# Patient Record
Sex: Female | Born: 1985 | Race: Black or African American | Hispanic: No | Marital: Single | Smoking: Never smoker
Health system: Southern US, Community
[De-identification: ages and names within clinical notes are randomized; demographics above are authoritative.]

## PROBLEM LIST (undated history)

## (undated) DIAGNOSIS — L309 Dermatitis, unspecified: Secondary | ICD-10-CM

## (undated) DIAGNOSIS — B009 Herpesviral infection, unspecified: Secondary | ICD-10-CM

## (undated) HISTORY — DX: Dermatitis, unspecified: L30.9

## (undated) HISTORY — DX: Herpesviral infection, unspecified: B00.9

---
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
## 2004-01-29 ENCOUNTER — Ambulatory Visit: Payer: Self-pay | Admitting: Family Medicine

## 2004-06-20 ENCOUNTER — Ambulatory Visit: Payer: Self-pay | Admitting: Family Medicine

## 2004-06-30 ENCOUNTER — Emergency Department (HOSPITAL_COMMUNITY): Admission: EM | Admit: 2004-06-30 | Discharge: 2004-06-30 | Payer: Self-pay | Admitting: Emergency Medicine

## 2004-10-24 ENCOUNTER — Emergency Department (HOSPITAL_COMMUNITY): Admission: EM | Admit: 2004-10-24 | Discharge: 2004-10-24 | Payer: Self-pay | Admitting: Family Medicine

## 2004-10-25 ENCOUNTER — Ambulatory Visit: Payer: Self-pay | Admitting: Family Medicine

## 2004-10-25 ENCOUNTER — Inpatient Hospital Stay (HOSPITAL_COMMUNITY): Admission: RE | Admit: 2004-10-25 | Discharge: 2004-10-25 | Payer: Self-pay | Admitting: *Deleted

## 2004-10-28 ENCOUNTER — Ambulatory Visit: Payer: Self-pay | Admitting: Family Medicine

## 2004-10-29 ENCOUNTER — Encounter (INDEPENDENT_AMBULATORY_CARE_PROVIDER_SITE_OTHER): Payer: Self-pay | Admitting: *Deleted

## 2004-10-29 LAB — CONVERTED CEMR LAB

## 2004-11-06 ENCOUNTER — Ambulatory Visit: Payer: Self-pay | Admitting: Family Medicine

## 2004-11-08 ENCOUNTER — Ambulatory Visit (HOSPITAL_COMMUNITY): Admission: RE | Admit: 2004-11-08 | Discharge: 2004-11-08 | Payer: Self-pay | Admitting: Family Medicine

## 2004-11-11 ENCOUNTER — Ambulatory Visit: Payer: Self-pay | Admitting: Family Medicine

## 2004-11-13 ENCOUNTER — Ambulatory Visit: Payer: Self-pay | Admitting: Family Medicine

## 2004-11-13 ENCOUNTER — Encounter: Admission: RE | Admit: 2004-11-13 | Discharge: 2004-11-13 | Payer: Self-pay | Admitting: Sports Medicine

## 2004-12-26 ENCOUNTER — Ambulatory Visit: Payer: Self-pay | Admitting: Family Medicine

## 2005-01-24 ENCOUNTER — Ambulatory Visit: Payer: Self-pay | Admitting: Family Medicine

## 2005-01-31 ENCOUNTER — Ambulatory Visit (HOSPITAL_COMMUNITY): Admission: RE | Admit: 2005-01-31 | Discharge: 2005-01-31 | Payer: Self-pay | Admitting: Sports Medicine

## 2005-02-11 ENCOUNTER — Ambulatory Visit: Payer: Self-pay | Admitting: Family Medicine

## 2005-03-14 ENCOUNTER — Ambulatory Visit: Payer: Self-pay | Admitting: Family Medicine

## 2005-04-18 ENCOUNTER — Ambulatory Visit: Payer: Self-pay | Admitting: Family Medicine

## 2005-05-02 ENCOUNTER — Ambulatory Visit: Payer: Self-pay | Admitting: Family Medicine

## 2005-05-16 ENCOUNTER — Ambulatory Visit: Payer: Self-pay | Admitting: Sports Medicine

## 2005-05-20 ENCOUNTER — Inpatient Hospital Stay (HOSPITAL_COMMUNITY): Admission: AD | Admit: 2005-05-20 | Discharge: 2005-05-20 | Payer: Self-pay | Admitting: Family Medicine

## 2005-05-20 ENCOUNTER — Ambulatory Visit: Payer: Self-pay | Admitting: Obstetrics and Gynecology

## 2005-05-30 ENCOUNTER — Ambulatory Visit: Payer: Self-pay | Admitting: Family Medicine

## 2005-06-07 ENCOUNTER — Ambulatory Visit: Payer: Self-pay | Admitting: Obstetrics and Gynecology

## 2005-06-07 ENCOUNTER — Inpatient Hospital Stay (HOSPITAL_COMMUNITY): Admission: AD | Admit: 2005-06-07 | Discharge: 2005-06-07 | Payer: Self-pay | Admitting: Obstetrics and Gynecology

## 2005-06-10 ENCOUNTER — Ambulatory Visit: Payer: Self-pay | Admitting: Family Medicine

## 2005-06-17 ENCOUNTER — Ambulatory Visit: Payer: Self-pay | Admitting: Family Medicine

## 2005-06-26 ENCOUNTER — Ambulatory Visit: Payer: Self-pay | Admitting: Family Medicine

## 2005-06-29 ENCOUNTER — Inpatient Hospital Stay (HOSPITAL_COMMUNITY): Admission: AD | Admit: 2005-06-29 | Discharge: 2005-06-29 | Payer: Self-pay | Admitting: Family Medicine

## 2005-06-29 ENCOUNTER — Ambulatory Visit: Payer: Self-pay | Admitting: Family Medicine

## 2005-06-29 ENCOUNTER — Inpatient Hospital Stay (HOSPITAL_COMMUNITY): Admission: AD | Admit: 2005-06-29 | Discharge: 2005-07-02 | Payer: Self-pay | Admitting: Family Medicine

## 2005-08-08 ENCOUNTER — Ambulatory Visit: Payer: Self-pay | Admitting: Family Medicine

## 2006-05-29 ENCOUNTER — Encounter (INDEPENDENT_AMBULATORY_CARE_PROVIDER_SITE_OTHER): Payer: Self-pay | Admitting: *Deleted

## 2006-06-02 ENCOUNTER — Emergency Department (HOSPITAL_COMMUNITY): Admission: EM | Admit: 2006-06-02 | Discharge: 2006-06-02 | Payer: Self-pay | Admitting: Emergency Medicine

## 2006-06-22 ENCOUNTER — Telehealth (INDEPENDENT_AMBULATORY_CARE_PROVIDER_SITE_OTHER): Payer: Self-pay | Admitting: *Deleted

## 2006-06-26 ENCOUNTER — Telehealth (INDEPENDENT_AMBULATORY_CARE_PROVIDER_SITE_OTHER): Payer: Self-pay | Admitting: Family Medicine

## 2006-06-26 ENCOUNTER — Ambulatory Visit: Payer: Self-pay | Admitting: Family Medicine

## 2006-06-26 DIAGNOSIS — L259 Unspecified contact dermatitis, unspecified cause: Secondary | ICD-10-CM

## 2006-06-30 ENCOUNTER — Encounter: Payer: Self-pay | Admitting: Family Medicine

## 2006-06-30 ENCOUNTER — Ambulatory Visit: Payer: Self-pay | Admitting: Sports Medicine

## 2006-06-30 ENCOUNTER — Telehealth: Payer: Self-pay | Admitting: *Deleted

## 2006-06-30 LAB — CONVERTED CEMR LAB: Whiff Test: POSITIVE

## 2006-07-01 LAB — CONVERTED CEMR LAB
Chlamydia, DNA Probe: POSITIVE — AB
GC Probe Amp, Genital: NEGATIVE

## 2006-07-02 ENCOUNTER — Ambulatory Visit: Payer: Self-pay | Admitting: Sports Medicine

## 2006-08-21 ENCOUNTER — Encounter: Payer: Self-pay | Admitting: *Deleted

## 2006-09-16 ENCOUNTER — Ambulatory Visit: Payer: Self-pay

## 2006-09-16 ENCOUNTER — Encounter: Payer: Self-pay | Admitting: Family Medicine

## 2006-09-16 ENCOUNTER — Telehealth: Payer: Self-pay | Admitting: *Deleted

## 2006-09-16 DIAGNOSIS — B009 Herpesviral infection, unspecified: Secondary | ICD-10-CM | POA: Insufficient documentation

## 2006-09-16 LAB — CONVERTED CEMR LAB
Chlamydia, DNA Probe: NEGATIVE
GC Probe Amp, Genital: NEGATIVE

## 2006-09-17 ENCOUNTER — Encounter: Payer: Self-pay | Admitting: Family Medicine

## 2006-09-18 ENCOUNTER — Telehealth: Payer: Self-pay | Admitting: *Deleted

## 2006-09-19 ENCOUNTER — Emergency Department (HOSPITAL_COMMUNITY): Admission: EM | Admit: 2006-09-19 | Discharge: 2006-09-19 | Payer: Self-pay | Admitting: Emergency Medicine

## 2006-09-21 ENCOUNTER — Telehealth: Payer: Self-pay | Admitting: Family Medicine

## 2006-09-22 ENCOUNTER — Encounter: Payer: Self-pay | Admitting: Family Medicine

## 2006-09-25 ENCOUNTER — Telehealth: Payer: Self-pay | Admitting: *Deleted

## 2006-10-12 ENCOUNTER — Telehealth: Payer: Self-pay | Admitting: *Deleted

## 2006-10-14 ENCOUNTER — Ambulatory Visit: Payer: Self-pay

## 2006-11-22 ENCOUNTER — Emergency Department (HOSPITAL_COMMUNITY): Admission: EM | Admit: 2006-11-22 | Discharge: 2006-11-22 | Payer: Self-pay | Admitting: Emergency Medicine

## 2006-12-01 ENCOUNTER — Ambulatory Visit: Payer: Self-pay | Admitting: Family Medicine

## 2006-12-01 DIAGNOSIS — F411 Generalized anxiety disorder: Secondary | ICD-10-CM | POA: Insufficient documentation

## 2006-12-21 ENCOUNTER — Telehealth: Payer: Self-pay | Admitting: *Deleted

## 2006-12-22 ENCOUNTER — Encounter (INDEPENDENT_AMBULATORY_CARE_PROVIDER_SITE_OTHER): Payer: Self-pay | Admitting: Family Medicine

## 2006-12-22 ENCOUNTER — Ambulatory Visit: Payer: Self-pay | Admitting: Family Medicine

## 2006-12-22 LAB — CONVERTED CEMR LAB
Beta hcg, urine, semiquantitative: NEGATIVE
Whiff Test: NEGATIVE

## 2006-12-23 ENCOUNTER — Encounter (INDEPENDENT_AMBULATORY_CARE_PROVIDER_SITE_OTHER): Payer: Self-pay | Admitting: *Deleted

## 2006-12-23 LAB — CONVERTED CEMR LAB
Chlamydia, DNA Probe: POSITIVE — AB
GC Probe Amp, Genital: NEGATIVE
HCT: 43.1 % (ref 36.0–46.0)
Hemoglobin: 13.9 g/dL (ref 12.0–15.0)
Hepatitis B Surface Ag: NEGATIVE
MCHC: 32.3 g/dL (ref 30.0–36.0)
MCV: 89 fL (ref 78.0–100.0)
Platelets: 284 10*3/uL (ref 150–400)
RBC: 4.84 M/uL (ref 3.87–5.11)
RDW: 13.3 % (ref 11.5–14.0)
WBC: 3.7 10*3/uL — ABNORMAL LOW (ref 4.0–10.5)

## 2006-12-24 ENCOUNTER — Ambulatory Visit: Payer: Self-pay | Admitting: Family Medicine

## 2006-12-28 ENCOUNTER — Telehealth (INDEPENDENT_AMBULATORY_CARE_PROVIDER_SITE_OTHER): Payer: Self-pay | Admitting: Family Medicine

## 2007-01-18 ENCOUNTER — Ambulatory Visit: Payer: Self-pay | Admitting: Sports Medicine

## 2007-01-18 ENCOUNTER — Encounter (INDEPENDENT_AMBULATORY_CARE_PROVIDER_SITE_OTHER): Payer: Self-pay | Admitting: Family Medicine

## 2007-01-18 ENCOUNTER — Telehealth (INDEPENDENT_AMBULATORY_CARE_PROVIDER_SITE_OTHER): Payer: Self-pay | Admitting: *Deleted

## 2007-01-19 LAB — CONVERTED CEMR LAB
Chlamydia, DNA Probe: NEGATIVE
GC Probe Amp, Genital: NEGATIVE

## 2007-03-19 ENCOUNTER — Ambulatory Visit: Payer: Self-pay | Admitting: Family Medicine

## 2007-03-19 ENCOUNTER — Encounter (INDEPENDENT_AMBULATORY_CARE_PROVIDER_SITE_OTHER): Payer: Self-pay | Admitting: Family Medicine

## 2007-03-19 ENCOUNTER — Telehealth: Payer: Self-pay | Admitting: *Deleted

## 2007-03-19 LAB — CONVERTED CEMR LAB: Whiff Test: POSITIVE

## 2007-04-07 ENCOUNTER — Telehealth (INDEPENDENT_AMBULATORY_CARE_PROVIDER_SITE_OTHER): Payer: Self-pay | Admitting: Family Medicine

## 2007-04-07 ENCOUNTER — Encounter (INDEPENDENT_AMBULATORY_CARE_PROVIDER_SITE_OTHER): Payer: Self-pay | Admitting: *Deleted

## 2008-02-08 ENCOUNTER — Telehealth: Payer: Self-pay | Admitting: *Deleted

## 2008-02-09 ENCOUNTER — Telehealth: Payer: Self-pay | Admitting: *Deleted

## 2008-02-09 ENCOUNTER — Encounter: Payer: Self-pay | Admitting: Family Medicine

## 2008-02-09 ENCOUNTER — Ambulatory Visit: Payer: Self-pay | Admitting: Family Medicine

## 2008-02-09 LAB — CONVERTED CEMR LAB
Chlamydia, DNA Probe: NEGATIVE
GC Probe Amp, Genital: NEGATIVE
Whiff Test: NEGATIVE

## 2008-02-29 ENCOUNTER — Telehealth: Payer: Self-pay | Admitting: *Deleted

## 2008-03-14 ENCOUNTER — Encounter: Payer: Self-pay | Admitting: Family Medicine

## 2008-03-28 ENCOUNTER — Telehealth: Payer: Self-pay | Admitting: *Deleted

## 2008-03-28 ENCOUNTER — Ambulatory Visit: Payer: Self-pay | Admitting: Family Medicine

## 2008-03-28 ENCOUNTER — Encounter: Payer: Self-pay | Admitting: Family Medicine

## 2008-03-29 ENCOUNTER — Telehealth: Payer: Self-pay | Admitting: *Deleted

## 2008-04-05 ENCOUNTER — Telehealth: Payer: Self-pay | Admitting: *Deleted

## 2008-05-04 ENCOUNTER — Encounter (INDEPENDENT_AMBULATORY_CARE_PROVIDER_SITE_OTHER): Payer: Self-pay | Admitting: Family Medicine

## 2008-05-04 ENCOUNTER — Ambulatory Visit: Payer: Self-pay | Admitting: Family Medicine

## 2008-05-09 ENCOUNTER — Encounter: Payer: Self-pay | Admitting: Family Medicine

## 2008-09-21 ENCOUNTER — Telehealth: Payer: Self-pay | Admitting: Family Medicine

## 2008-09-22 ENCOUNTER — Telehealth: Payer: Self-pay | Admitting: Family Medicine

## 2008-09-26 ENCOUNTER — Encounter: Payer: Self-pay | Admitting: Family Medicine

## 2008-09-29 ENCOUNTER — Encounter: Payer: Self-pay | Admitting: Family Medicine

## 2008-10-23 ENCOUNTER — Encounter: Payer: Self-pay | Admitting: Family Medicine

## 2008-10-23 ENCOUNTER — Ambulatory Visit: Payer: Self-pay | Admitting: Family Medicine

## 2008-10-23 LAB — CONVERTED CEMR LAB
Chlamydia, DNA Probe: NEGATIVE
GC Probe Amp, Genital: NEGATIVE
Whiff Test: NEGATIVE

## 2008-10-24 ENCOUNTER — Encounter: Payer: Self-pay | Admitting: Family Medicine

## 2008-12-08 ENCOUNTER — Encounter: Payer: Self-pay | Admitting: Family Medicine

## 2008-12-08 ENCOUNTER — Ambulatory Visit: Payer: Self-pay | Admitting: Family Medicine

## 2008-12-08 LAB — CONVERTED CEMR LAB
Beta hcg, urine, semiquantitative: NEGATIVE
Whiff Test: POSITIVE

## 2008-12-11 ENCOUNTER — Ambulatory Visit: Payer: Self-pay | Admitting: Family Medicine

## 2008-12-11 LAB — CONVERTED CEMR LAB
Chlamydia, DNA Probe: POSITIVE — AB
GC Probe Amp, Genital: NEGATIVE

## 2009-07-25 ENCOUNTER — Telehealth (INDEPENDENT_AMBULATORY_CARE_PROVIDER_SITE_OTHER): Payer: Self-pay | Admitting: *Deleted

## 2010-01-02 ENCOUNTER — Telehealth: Payer: Self-pay | Admitting: Family Medicine

## 2010-01-03 ENCOUNTER — Ambulatory Visit: Payer: Self-pay | Admitting: Family Medicine

## 2010-04-23 ENCOUNTER — Other Ambulatory Visit: Payer: Self-pay | Admitting: Family Medicine

## 2010-04-23 ENCOUNTER — Encounter: Payer: Self-pay | Admitting: Family Medicine

## 2010-04-23 ENCOUNTER — Ambulatory Visit: Admission: RE | Admit: 2010-04-23 | Discharge: 2010-04-23 | Payer: Self-pay | Source: Home / Self Care

## 2010-04-23 ENCOUNTER — Other Ambulatory Visit
Admission: RE | Admit: 2010-04-23 | Discharge: 2010-04-23 | Payer: Self-pay | Source: Home / Self Care | Admitting: Family Medicine

## 2010-04-23 DIAGNOSIS — N760 Acute vaginitis: Secondary | ICD-10-CM | POA: Insufficient documentation

## 2010-04-23 LAB — CONVERTED CEMR LAB
Beta hcg, urine, semiquantitative: NEGATIVE
Chlamydia, Swab/Urine, PCR: NEGATIVE
Whiff Test: POSITIVE

## 2010-04-24 ENCOUNTER — Telehealth: Payer: Self-pay | Admitting: Family Medicine

## 2010-04-30 NOTE — Assessment & Plan Note (Signed)
Summary: meds refill/[place]/white team   Vital Signs:  Patient profile:   25 year old female Height:      66 inches Weight:      118 pounds BMI:     19.11 Temp:     98.6 degrees F oral Pulse rate:   87 / minute BP sitting:   100 / 72  (left arm) Cuff size:   regular  Vitals Entered By: Jimmy Footman, CMA (January 03, 2010 1:49 PM) CC: med refill Is Patient Diabetic? No Pain Assessment Patient in pain? no        Primary Provider:  . [facility]  CC:  med refill.  History of Present Illness: Pt comes in today for eczema medication refill. Pt states that she has run out.  She believes that this most recent breakout is due to using a new body wash. Itching is worse after warm shower. She has been using moisturizers every day, "head to toe."  Uses eczema medications almost every day. No other complaints or problems.   Habits & Providers  Alcohol-Tobacco-Diet     Tobacco Status: never  Current Problems (verified): 1)  Contraceptive Management  (ICD-V25.09) 2)  Well Woman  (ICD-V70.0) 3)  Anxiety State Nos  (ICD-300.00) 4)  Hsv  (ICD-054.9) 5)  Eczema  (ICD-692.9)  Current Medications (verified): 1)  Lidex 0.05 % Crea (Fluocinonide) .... Apply To Affected Areas (Not Face) Two Times A Day Until Rash Cleared.  Then Stop 2)  Hydrocortisone 2.5 % Oint (Hydrocortisone) .... Apply To Rash On Face Two Times A Day Until Rash Clears; Then Stop  Allergies (verified): No Known Drug Allergies  Physical Exam  General:  alert, well-developed, well-nourished, and well-hydrated.   Skin:  Small area of white papules on R antecubital area with surrounding lichenification of skin. Areas of excoriation and lichenification on pt's back, mostly on side just above hips. No obvious areas of eczema on face, but multiple areas with raised, skin-colored bumps, no exudate or drainage, no erythema.    Impression & Recommendations:  Problem # 1:  ECZEMA (ICD-692.9)  While I have not seen this pt  before, her eczema appears to be relatively mild. We discussed the importance of not using the medications every day, to only use them on the affected areas when there is an active break out. Also discussed using moisturizers more frequently and avoiding bodywashes, soaps, laundry detergents, etc that have fragrances or other harsh chemicals in them; sticking to things like Dove and [employer].   Her updated medication list for this problem includes:    Lidex 0.05 % Crea (Fluocinonide) .Marland Kitchen... Apply to affected areas (not face) two times a day until rash cleared.  then stop    Hydrocortisone 2.5 % Oint (Hydrocortisone) .Marland Kitchen... Apply to rash on face two times a day until rash clears; then stop  Orders: [facility]- Est Level  3 (78469)  Problem # 2:  Preventive Health Care (ICD-V70.0) Pt due for next pap 2/12. Pt informed and will make appt for this.   Complete Medication List: 1)  Lidex 0.05 % Crea (Fluocinonide) .... Apply to affected areas (not face) two times a day until rash cleared.  then stop 2)  Hydrocortisone 2.5 % Oint (Hydrocortisone) .... Apply to rash on face two times a day until rash clears; then stop  Patient Instructions: 1)  It was nice to meet you today! 2)  I am refilling your eczema medications. Continue to use a thick moisturizing cream on your  face and body every day (twice a day if possible), but only use these medicines when you have an active eczema outbreak. Do not use these medicines routinely every day. 3)  Examples of good moisturizers include Cetaphil cream and Eucerin. You can also use vaseline on very dry areas.  Taking cool showers and drying off completely with a towel after showers may also help. 4)  Please schedule an appt for a well woman visit in Feb or March for your next pap smear.  Prescriptions: HYDROCORTISONE 2.5 % OINT (HYDROCORTISONE) apply to rash on face two times a day until rash clears; then STOP  #1large tube x 0   Entered and Authorized by:   Demetria Pore MD   Signed by:   Demetria Pore MD on 01/03/2010   Method used:   Print then Give to Patient   RxID:   [id number] LIDEX 0.05 % CREA (FLUOCINONIDE) apply to affected areas (not face) two times a day until rash cleared.  then stop  #1 tube x 1   Entered and Authorized by:   Demetria Pore MD   Signed by:   Demetria Pore MD on 01/03/2010   Method used:   Print then Give to Patient   RxID:   [id number] HYDROCORTISONE 2.5 % OINT (HYDROCORTISONE) apply to rash on face two times a day until rash clears; then STOP  #1large tube x 0   Entered and Authorized by:   Demetria Pore MD   Signed by:   Demetria Pore MD on 01/03/2010   Method used:   Print then Give to Patient   RxID:   [id number] LIDEX 0.05 % CREA (FLUOCINONIDE) apply to affected areas (not face) two times a day until rash cleared.  then stop  #1 tube x 1   Entered and Authorized by:   Demetria Pore MD   Signed by:   Demetria Pore MD on 01/03/2010   Method used:   Print then Give to Patient   RxID:   [id number]

## 2010-04-30 NOTE — Progress Notes (Signed)
Summary: refill  Phone Note Refill Request Call back at Home Phone [phone] Message from:  Patient  Refills Requested: Medication #1:  LIDEX 0.05 % CREA apply to affected areas (not face) two times a day until rash cleared.  then stop also Triamcinilone [facility]  Initial call taken by: De Nurse,  January 02, 2010 10:35 AM  Follow-up for Phone Call        told her we could not fill these as it has been over 1 yr sinc seen. she will see Dr. Fara Boros at 1:30 tomorrow Follow-up by: Golden Circle RN,  January 02, 2010 10:38 AM

## 2010-04-30 NOTE — Progress Notes (Signed)
Summary: Rx Prob  Phone Note Call from Patient Call back at [phone]   Caller: Patient Summary of Call: Pt says her rx for exzema was denied and was wondering why. Initial call taken by: Clydell Hakim,  July 25, 2009 4:39 PM  Follow-up for Phone Call        patient advised that rx has been sent in for lidex and hydrocortisone cream.  triamcinolone was denied because she is no longer on this. halog was changed to lidex.  she voices undersdtanding. Follow-up by: Theresia Lo RN,  July 25, 2009 5:03 PM

## 2010-05-02 ENCOUNTER — Ambulatory Visit (INDEPENDENT_AMBULATORY_CARE_PROVIDER_SITE_OTHER): Payer: Medicaid Other

## 2010-05-02 ENCOUNTER — Encounter: Payer: Self-pay | Admitting: Family Medicine

## 2010-05-02 DIAGNOSIS — R87610 Atypical squamous cells of undetermined significance on cytologic smear of cervix (ASC-US): Secondary | ICD-10-CM | POA: Insufficient documentation

## 2010-05-02 NOTE — Progress Notes (Signed)
Summary: meds prob  Phone Note Call from Patient Call back at Home Phone [phone]   Caller: Patient Summary of Call: needs something stronger than Fluconazole [facility] Initial call taken by: De Nurse,  April 24, 2010 11:35 AM  Follow-up for Phone Call        Called patient back. Already out of lidex lotion due to the significant involved areas. Will try a slightly different formulation. Reinforced the advice to not use everyday. Only for next 10 days. She is using lotion daily, but unsure of formulation. Advised daily vaseline, cocoa butter, eucerin or cetaphil. PATP.  Follow-up by: Lloyd Huger MD,  April 25, 2010 10:48 AM    New/Updated Medications: TRIAMCINOLONE ACETONIDE 0.5 % CREA (TRIAMCINOLONE ACETONIDE) Apply to affected areas as directed. No more than 10 days. Prescriptions: TRIAMCINOLONE ACETONIDE 0.5 % CREA (TRIAMCINOLONE ACETONIDE) Apply to affected areas as directed. No more than 10 days.  #1 x 0   Entered and Authorized by:   Lloyd Huger MD   Signed by:   Lloyd Huger MD on 04/25/2010   Method used:   Electronically to        [facility]. [address] [phone]* (retail)       [address]       [place], [place]  [address]       Ph: [phone] or [phone]       Fax: [phone]   RxID:   [id number]

## 2010-05-02 NOTE — Assessment & Plan Note (Signed)
Summary: cpp/eo  depo given, pt to rtc 04/11-04/25/2012   Vital Signs:  Patient profile:   25 year old female Height:      66 inches Weight:      117.4 pounds BMI:     19.02 Temp:     98.2 degrees F oral Pulse rate:   101 / minute BP sitting:   111 / 77  (left arm) Cuff size:   regular  Vitals Entered By: Jimmy Footman, CMA (April 23, 2010 2:27 PM) CC: cpe w/pap, vaginal discharge Is Patient Diabetic? No Pain Assessment Patient in pain? no        Primary Provider:  . [facility]  CC:  cpe w/pap and vaginal discharge.  History of Present Illness: 1. Vaginal DC. Has been present for 2 days. Hx of recurrent BV that has been treated, had metro-gel and pills in the past. Denies bleeding, pain, recent medications.   2. Birth control. Desires contraception. Unsure between pills and depo. Has taken depo injection in the past. Does not smoke, no family or personal hx of thrombophilia/VTE.  Not currently sexually active.   3. Eczema. Is controlled currently, but does need refill of her steroid ointment. Uses only when symptoms are flared.   Preventive Screening-Counseling & Management  Alcohol-Tobacco     Smoking Status: never  Allergies: No Known Drug Allergies  Past History:  Past Medical History: Last updated: 05/04/2008 Chlamydia 01/2003--treated, Menarche age 72 (5 days, regular), Plan B Rx provided 07/2003, SVD girl no complications sickle cell trait  Past Surgical History: Last updated: 05/04/2008 none  Family History: Last updated: 05/04/2008 F-, healthy half sister - sickle cell disease M-sickle cell trait sister -healthy daughter--sickle cell trait  Social History: Last updated: 05/04/2008 lives with daughter iin l; works in Clinical biochemist; taking a semester off from [employer]; no tobacco/ETOH/drugs; sexually active; no current partner  Risk Factors: Alcohol Use: 0 (12/08/2008) Exercise: no (12/01/2006)  Risk Factors: Smoking Status: never  (04/23/2010) Passive Smoke Exposure: no (12/01/2006)  Review of Systems  The patient denies anorexia, fever, dyspnea on exertion, peripheral edema, headaches, and abdominal pain.    Physical Exam  General:  Well-developed,well-nourished,in no acute distress; alert,appropriate and cooperative throughout examination Head:  normocephalic and atraumatic.   Mouth:  Oral mucosa and oropharynx without lesions or exudates.   Lungs:  Normal respiratory effort, chest expands symmetrically. Lungs are clear to auscultation, no crackles or wheezes. Heart:  Normal rate and regular rhythm. S1 and S2 normal without gallop, murmur, click, rub or other extra sounds. Genitalia:  Normal introitus for age, no external lesions, no vaginal discharge, mucosa pink and moist, no vaginal or cervical lesions, no vaginal atrophy, no friaility or hemorrhage,  no adnexal masses or tenderness Extremities:  No clubbing, cyanosis, edema, or deformity noted with normal full range of motion of all joints.   Neurologic:  No cranial nerve deficits noted. Station and gait are normal.Sensory, motor and coordinative functions appear intact. Skin:  2cm areas of dried, hyperkeratotic plaque in flexural arms. No oozing or bleeding.    Impression & Recommendations:  Problem # 1:  UNSPECIFIED VAGINITIS AND VULVOVAGINITIS (ICD-616.10) Wet prep shows BV and yeast. Will treat both. F/u G[facility]chlamydia. [phone] GC/Chlamydia-[facility] (87591/87491) [facility]- Est  Level 4 (30160)  Her updated medication list for this problem includes:    Metronidazole 500 Mg Tabs (Metronidazole) .Marland Kitchen... Take one tab by mouth two times a day for 7 days  Problem # 2:  CONTRACEPTIVE MANAGEMENT (  ICD-V25.09) Will start Depo injection q3 months. Counseled pt on side effects and discussed other options including OCPs and IUD. Patient felt this method to suit her desires and lifestyle at this time.  Orders: U Preg-[facility] (11914) Depo-Provera 150mg   (J1055) [facility]- Est  Level 4 (78295)  Problem # 3:  ECZEMA (ICD-692.9)  Controlled currently. Refilled ointment and reinforced skin moisturizing as the primary treatment of eczema. Using steroids as needed for flares only.  Her updated medication list for this problem includes:    Lidex 0.05 % Crea (Fluocinonide) .Marland Kitchen... Apply to affected areas (not face) two times a day until rash cleared.  then stop    Hydrocortisone 2.5 % Oint (Hydrocortisone) .Marland Kitchen... Apply to rash on face two times a day until rash clears; then stop  Orders: [facility]- Est  Level 4 (99214)  Complete Medication List: 1)  Lidex 0.05 % Crea (Fluocinonide) .... Apply to affected areas (not face) two times a day until rash cleared.  then stop 2)  Hydrocortisone 2.5 % Oint (Hydrocortisone) .... Apply to rash on face two times a day until rash clears; then stop 3)  Metronidazole 500 Mg Tabs (Metronidazole) .... Take one tab by mouth two times a day for 7 days 4)  Fluconazole 150 Mg Tabs (Fluconazole) .... Take one tab by mouth once.  Other Orders: Pap Smear-[facility] (62130-86578)  Patient Instructions: 1)  Nice to meet you. 2)  I will call you if your tests are not normal. 3)  Please make a nursing appointment for Depo shot in 3 months. 4)  If you decide to try birth control pills, call the office.  5)  Your refills will be sent to [facility]. Prescriptions: FLUCONAZOLE 150 MG TABS (FLUCONAZOLE) take one tab by mouth once.  #1 x 0   Entered and Authorized by:   Lloyd Huger MD   Signed by:   Lloyd Huger MD on 04/23/2010   Method used:   Electronically to        [facility]. [address] [phone]* (retail)       [address]       [place], [place]  [address]       Ph: [phone] or [phone]       Fax: [phone]   RxID:   [id number] METRONIDAZOLE 500 MG TABS (METRONIDAZOLE) Take one tab by mouth two times a day for 7 days  #14 x 0   Entered and Authorized by:   Lloyd Huger MD   Signed by:   Lloyd Huger MD on  04/23/2010   Method used:   Electronically to        [facility] [address] [phone]* (retail)       [address]       [place], [place]  [address]       Ph: [phone] or [phone]       Fax: [phone]   RxID:   [id number] HYDROCORTISONE 2.5 % OINT (HYDROCORTISONE) apply to rash on face two times a day until rash clears; then STOP  #1large tube x 1   Entered and Authorized by:   Lloyd Huger MD   Signed by:   Lloyd Huger MD on 04/23/2010   Method used:   Electronically to        [facility] [address] [phone]* (retail)       [address]  [place], [place]  [address]       Ph: [phone] or [phone]       Fax: [phone]   RxID:   [id number] LIDEX 0.05 % CREA (FLUOCINONIDE) apply to affected areas (not face) two times a day until rash cleared.  then stop  #1 tube x 1   Entered and Authorized by:   Lloyd Huger MD   Signed by:   Lloyd Huger MD on 04/23/2010   Method used:   Electronically to        [facility] [address] [phone]* (retail)       [address]       [place], [place]  [address]       Ph: [phone] or [phone]       Fax: [phone]   RxID:   [id number]    Medication Administration  Injection # 1:    Medication: Depo-Provera 150mg     Diagnosis: CONTRACEPTIVE MANAGEMENT (ICD-V25.09)    Route: IM    Site: RUOQ gluteus    Exp Date: 08/28/2012    Lot #: [id number]    Mfr: greenstone    Comments: pt to rtc 04/11-04/25/2012    Patient tolerated injection without complications    Given by: Loralee Pacas CMA (April 23, 2010 5:59 PM)  Orders Added: 1)  Wet Prep- [facility] [06237] 2)  Pap Smear-[facility] [62831-51761] 3)  U Preg-[facility] [81025] 4)  GC/Chlamydia-[facility] [87591/87491] 5)  Depo-Provera 150mg  [J1055] 6)  [facility]- Est  Level 4 [60737]    Laboratory Results   Urine Tests  Date/Time Received: April 23, 2010 2:50 PM  Date/Time Reported: April 23, 2010 3:15 PM     Urine HCG:  negative Comments: ...............test performed by......Marland KitchenBonnie A. [place], MLS (ASCP)cm  Date/Time Received: April 23, 2010 2:50 PM  Date/Time Reported: April 23, 2010 3:16 PM   [employer] Source: vag WBC/hpf: 10-20 Bacteria/hpf: 3+  Cocci Clue cells/hpf: many  Positive whiff Yeast/hpf: few Trichomonas/hpf: none Comments: ...............test performed by......Marland KitchenBonnie A. [place], MLS (ASCP)cm

## 2010-05-08 NOTE — Assessment & Plan Note (Signed)
Summary: f/u test results/eo   Vital Signs:  Patient profile:   25 year old female Height:      66 inches Weight:      116 pounds Temp:     98.9 degrees F oral Pulse rate:   90 / minute Pulse rhythm:   regular BP sitting:   129 / 85  (left arm) Cuff size:   regular  Vitals Entered By: Loralee Pacas CMA (May 02, 2010 12:01 PM)  Primary Care Provider:  . [facility]   History of Present Illness: 25 yo female coming in today after having abnormal pap smear results.  Pt states she was told she had HPV and that it increased her risk for cancer. Pt wants to know the next step.   Current Medications (verified): 1)  Lidex 0.05 % Crea (Fluocinonide) .... Apply To Affected Areas (Not Face) Two Times A Day Until Rash Cleared.  Then Stop 2)  Hydrocortisone 2.5 % Oint (Hydrocortisone) .... Apply To Rash On Face Two Times A Day Until Rash Clears; Then Stop 3)  Metronidazole 500 Mg Tabs (Metronidazole) .... Take One Tab By Mouth Two Times A Day For 7 Days 4)  Fluconazole 150 Mg Tabs (Fluconazole) .... Take One Tab By Mouth Once. 5)  Triamcinolone Acetonide 0.5 % Crea (Triamcinolone Acetonide) .... Apply To Affected Areas As Directed. No More Than 10 Days.  Allergies (verified): No Known Drug Allergies  Physical Exam  General:  NAD  Lungs:  Normal respiratory effort, chest expands symmetrically. Lungs are clear to auscultation, no crackles or wheezes. Heart:  Normal rate and regular rhythm. S1 and S2 normal without gallop, murmur, click, rub or other extra sounds. Extremities:  No clubbing, cyanosis, edema, or deformity noted with normal full range of motion of all joints.     Impression & Recommendations:  Problem # 1:  ASCUS PAP (ICD-795.01)  Pt was told what the pap results are and what high risk HPV is.  Pt questions and concern were addressed fully and even though pt would have like to have a colpo done today she does have her child with her today.  Pt will come back to colpo  clinic at the next earliest appointment for colposcopy.  Pt made aware waiting 1-2 weeks would not cause [place] to behind on her care or for the results to change that dramatically.  Pt was in agreement with the plan.  Orders: [facility]- Est Level  3 (99213)  Complete Medication List: 1)  Lidex 0.05 % Crea (Fluocinonide) .... Apply to affected areas (not face) two times a day until rash cleared.  then stop 2)  Hydrocortisone 2.5 % Oint (Hydrocortisone) .... Apply to rash on face two times a day until rash clears; then stop 3)  Metronidazole 500 Mg Tabs (Metronidazole) .... Take one tab by mouth two times a day for 7 days 4)  Fluconazole 150 Mg Tabs (Fluconazole) .... Take one tab by mouth once. 5)  Triamcinolone Acetonide 0.5 % Crea (Triamcinolone acetonide) .... Apply to affected areas as directed. no more than 10 days. [facility]- Est Level  3 (13086)   Orders Added: 1)  [facility]- Est Level  3 [57846]     Impression & Recommendations:   Orders: [facility]- Est Level  3 (96295)    Complete Medication List: 1)  Lidex 0.05 % Crea (Fluocinonide) .... Apply to affected areas (not face) two times a day until rash cleared.  then stop 2)  Hydrocortisone 2.5 % Oint (Hydrocortisone) .Marland KitchenMarland KitchenMarland Kitchen  Apply to rash on face two times a day until rash clears; then stop 3)  Metronidazole 500 Mg Tabs (Metronidazole) .... Take one tab by mouth two times a day for 7 days 4)  Fluconazole 150 Mg Tabs (Fluconazole) .... Take one tab by mouth once. 5)  Triamcinolone Acetonide 0.5 % Crea (Triamcinolone acetonide) .... Apply to affected areas as directed. no more than 10 days.

## 2010-05-16 ENCOUNTER — Ambulatory Visit (INDEPENDENT_AMBULATORY_CARE_PROVIDER_SITE_OTHER): Payer: Medicaid Other | Admitting: Family Medicine

## 2010-05-16 VITALS — BP 111/76 | HR 85 | Temp 98.4°F | Ht 67.0 in | Wt 114.4 lb

## 2010-05-16 DIAGNOSIS — R87610 Atypical squamous cells of undetermined significance on cytologic smear of cervix (ASC-US): Secondary | ICD-10-CM

## 2010-05-16 NOTE — Progress Notes (Signed)
  Subjective:    Patient ID: Sharon Joseph, female    DOB: [birth year], 25 y.o.   MRN: [id number]  HPI  Pt is here for colposcopy, after ASCUS. Pt was consented without any questions.  Time out done   Review of Systems     Objective:   Physical Exam .   Gen: NAD Pelvic:  Normal external female genitalia no masses or lesions Vagina:  Vault healthy pink in color  Cervix:  Mild mucous discharge, no lesions or masses seen.  Transitional zone seen no irregularities.  Acetic acid applied, no lesions seen with microscope or with greenfield lens      Assessment & Plan:

## 2010-05-16 NOTE — Assessment & Plan Note (Signed)
Colpo normal no biopsies taken, would put pt on regular pap smear schedule and get repeat in 1 year.

## 2010-07-03 ENCOUNTER — Other Ambulatory Visit: Payer: Self-pay | Admitting: Family Medicine

## 2010-07-03 NOTE — Telephone Encounter (Signed)
Refill request for patient of Dr. Konkol's 

## 2010-07-04 NOTE — Telephone Encounter (Signed)
Refill request

## 2010-07-10 ENCOUNTER — Ambulatory Visit (INDEPENDENT_AMBULATORY_CARE_PROVIDER_SITE_OTHER): Payer: Medicaid Other | Admitting: *Deleted

## 2010-07-10 DIAGNOSIS — Z309 Encounter for contraceptive management, unspecified: Secondary | ICD-10-CM

## 2010-07-10 MED ORDER — MEDROXYPROGESTERONE ACETATE 150 MG/ML IM SUSP
150.0000 mg | Freq: Once | INTRAMUSCULAR | Status: AC
  Administered 2010-07-10: 150 mg via INTRAMUSCULAR

## 2010-07-15 ENCOUNTER — Other Ambulatory Visit: Payer: Self-pay | Admitting: Family Medicine

## 2010-07-15 DIAGNOSIS — Z309 Encounter for contraceptive management, unspecified: Secondary | ICD-10-CM

## 2010-07-15 MED ORDER — MEDROXYPROGESTERONE ACETATE 150 MG/ML IM SUSP: 150.0000 mg | INTRAMUSCULAR | Status: AC

## 2010-08-03 ENCOUNTER — Emergency Department (HOSPITAL_COMMUNITY): Payer: Medicaid Other

## 2010-08-03 ENCOUNTER — Emergency Department (HOSPITAL_COMMUNITY)
Admission: EM | Admit: 2010-08-03 | Discharge: 2010-08-03 | Disposition: A | Discharge status: Home / Self Care | Payer: Medicaid Other | Attending: Emergency Medicine | Admitting: Emergency Medicine

## 2010-08-03 DIAGNOSIS — S81809A Unspecified open wound, unspecified lower leg, initial encounter: Secondary | ICD-10-CM | POA: Insufficient documentation

## 2010-08-03 DIAGNOSIS — S81009A Unspecified open wound, unspecified knee, initial encounter: Secondary | ICD-10-CM | POA: Insufficient documentation

## 2010-08-09 ENCOUNTER — Other Ambulatory Visit: Payer: Self-pay | Admitting: Family Medicine

## 2010-08-09 DIAGNOSIS — L309 Dermatitis, unspecified: Secondary | ICD-10-CM

## 2010-08-14 ENCOUNTER — Other Ambulatory Visit: Payer: Self-pay | Admitting: Family Medicine

## 2010-08-14 NOTE — Telephone Encounter (Signed)
Refill request

## 2010-08-14 NOTE — Telephone Encounter (Signed)
Please advise about refilling  Lidex cream.

## 2010-08-16 ENCOUNTER — Ambulatory Visit (INDEPENDENT_AMBULATORY_CARE_PROVIDER_SITE_OTHER): Payer: Medicaid Other | Admitting: *Deleted

## 2010-08-16 DIAGNOSIS — Z4889 Encounter for other specified surgical aftercare: Secondary | ICD-10-CM

## 2010-08-16 NOTE — Progress Notes (Signed)
Patient in to remove  sutures from  laceration she sustained 08/03/2010. Six sutures removed. Advised to clean wound daily with soap and water. Keep area covered if there is still any opened area . Cleaned today with saline and antibiotic ointment applied. Dressing applied. No redness or swelling noted. Scab formation over wound.

## 2010-09-25 ENCOUNTER — Ambulatory Visit (INDEPENDENT_AMBULATORY_CARE_PROVIDER_SITE_OTHER): Payer: Medicaid Other | Admitting: *Deleted

## 2010-09-25 DIAGNOSIS — Z309 Encounter for contraceptive management, unspecified: Secondary | ICD-10-CM

## 2010-09-25 MED ORDER — MEDROXYPROGESTERONE ACETATE 150 MG/ML IM SUSP
150.0000 mg | Freq: Once | INTRAMUSCULAR | Status: AC
  Administered 2010-09-25: 150 mg via INTRAMUSCULAR

## 2010-10-21 ENCOUNTER — Other Ambulatory Visit: Payer: Self-pay | Admitting: Family Medicine

## 2010-10-21 NOTE — Telephone Encounter (Signed)
Refill request

## 2010-12-11 ENCOUNTER — Ambulatory Visit (INDEPENDENT_AMBULATORY_CARE_PROVIDER_SITE_OTHER): Payer: Medicaid Other | Admitting: *Deleted

## 2010-12-11 DIAGNOSIS — Z309 Encounter for contraceptive management, unspecified: Secondary | ICD-10-CM

## 2010-12-11 MED ORDER — MEDROXYPROGESTERONE ACETATE 150 MG/ML IM SUSP
150.0000 mg | Freq: Once | INTRAMUSCULAR | Status: AC
  Administered 2010-12-11: 150 mg via INTRAMUSCULAR

## 2011-01-13 ENCOUNTER — Other Ambulatory Visit: Payer: Self-pay | Admitting: Family Medicine

## 2011-01-13 MED ORDER — FLUOCINONIDE 0.05 % EX CREA: TOPICAL_CREAM | Freq: Two times a day (BID) | CUTANEOUS | Status: DC

## 2011-01-13 NOTE — Telephone Encounter (Signed)
Refill request

## 2011-02-24 ENCOUNTER — Other Ambulatory Visit: Payer: Self-pay | Admitting: Family Medicine

## 2011-02-24 ENCOUNTER — Ambulatory Visit (INDEPENDENT_AMBULATORY_CARE_PROVIDER_SITE_OTHER): Payer: Medicaid Other | Admitting: *Deleted

## 2011-02-24 DIAGNOSIS — Z309 Encounter for contraceptive management, unspecified: Secondary | ICD-10-CM

## 2011-02-24 MED ORDER — MEDROXYPROGESTERONE ACETATE 150 MG/ML IM SUSP
150.0000 mg | Freq: Once | INTRAMUSCULAR | Status: AC
Start: 2011-02-24 — End: 2011-02-24
  Administered 2011-02-24: 150 mg via INTRAMUSCULAR

## 2011-02-24 NOTE — Telephone Encounter (Signed)
Refill request

## 2011-04-07 ENCOUNTER — Other Ambulatory Visit: Payer: Self-pay | Admitting: Family Medicine

## 2011-04-07 NOTE — Telephone Encounter (Signed)
Refill request

## 2011-05-12 ENCOUNTER — Ambulatory Visit (INDEPENDENT_AMBULATORY_CARE_PROVIDER_SITE_OTHER): Payer: Medicaid Other | Admitting: *Deleted

## 2011-05-12 DIAGNOSIS — Z309 Encounter for contraceptive management, unspecified: Secondary | ICD-10-CM

## 2011-05-12 MED ORDER — MEDROXYPROGESTERONE ACETATE 150 MG/ML IM SUSP
150.0000 mg | Freq: Once | INTRAMUSCULAR | Status: AC
  Administered 2011-05-12: 150 mg via INTRAMUSCULAR

## 2011-05-12 NOTE — Progress Notes (Signed)
Next depo due April 29 thru Aug 11, 2011.

## 2011-05-27 IMAGING — CR DG TIBIA/FIBULA 2V*R*
4 series · 4 of 4 positions shown · non-contrast
Comparison: None.

CLINICAL DATA: Trauma.  Hit leg against bar with gash to the
anterior lower tib-fib area.

RIGHT TIBIA AND FIBULA - 2 VIEW

[t tib/fib ap right (1 of 2)]
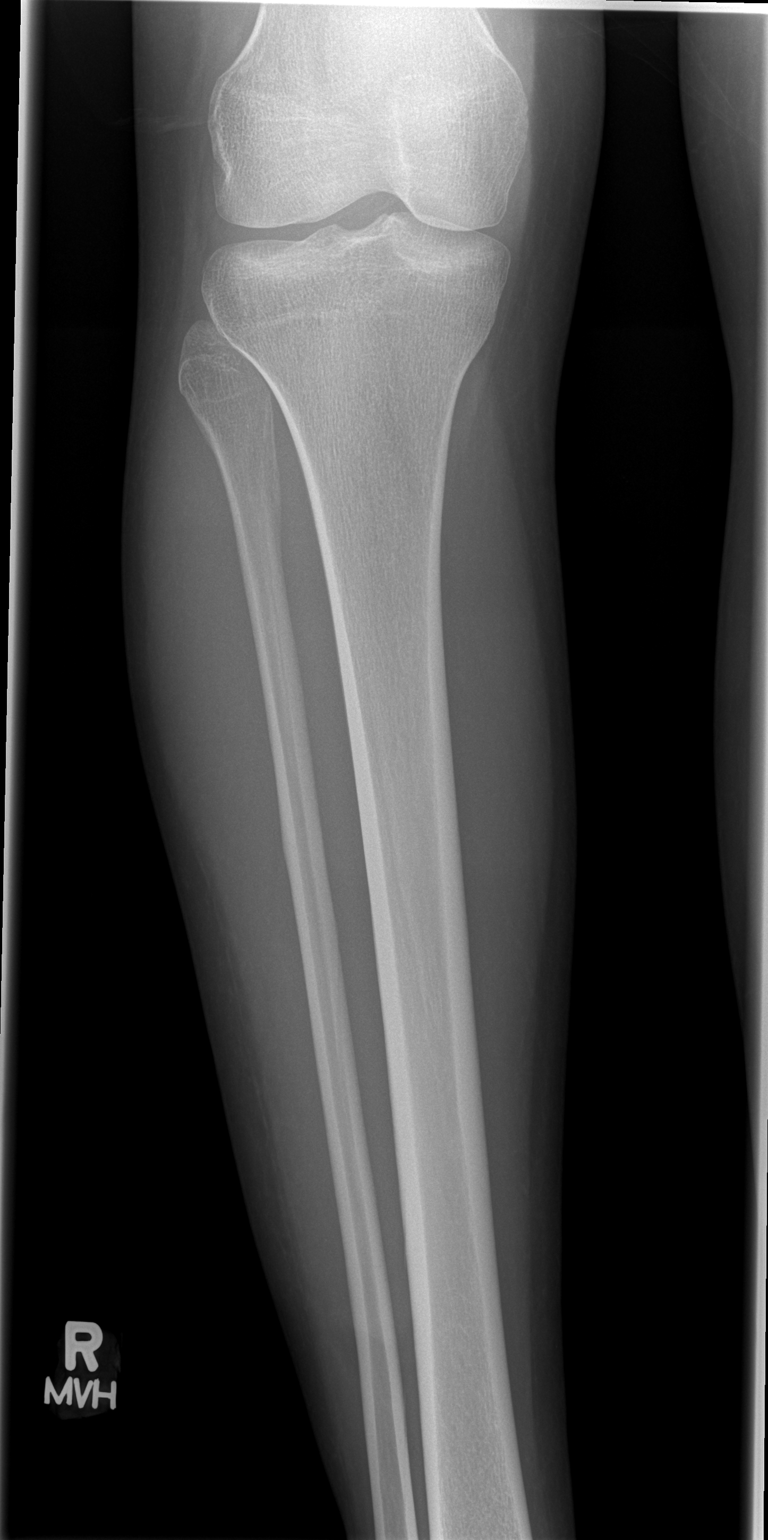

[t tib/fib lat right (1 of 2)]
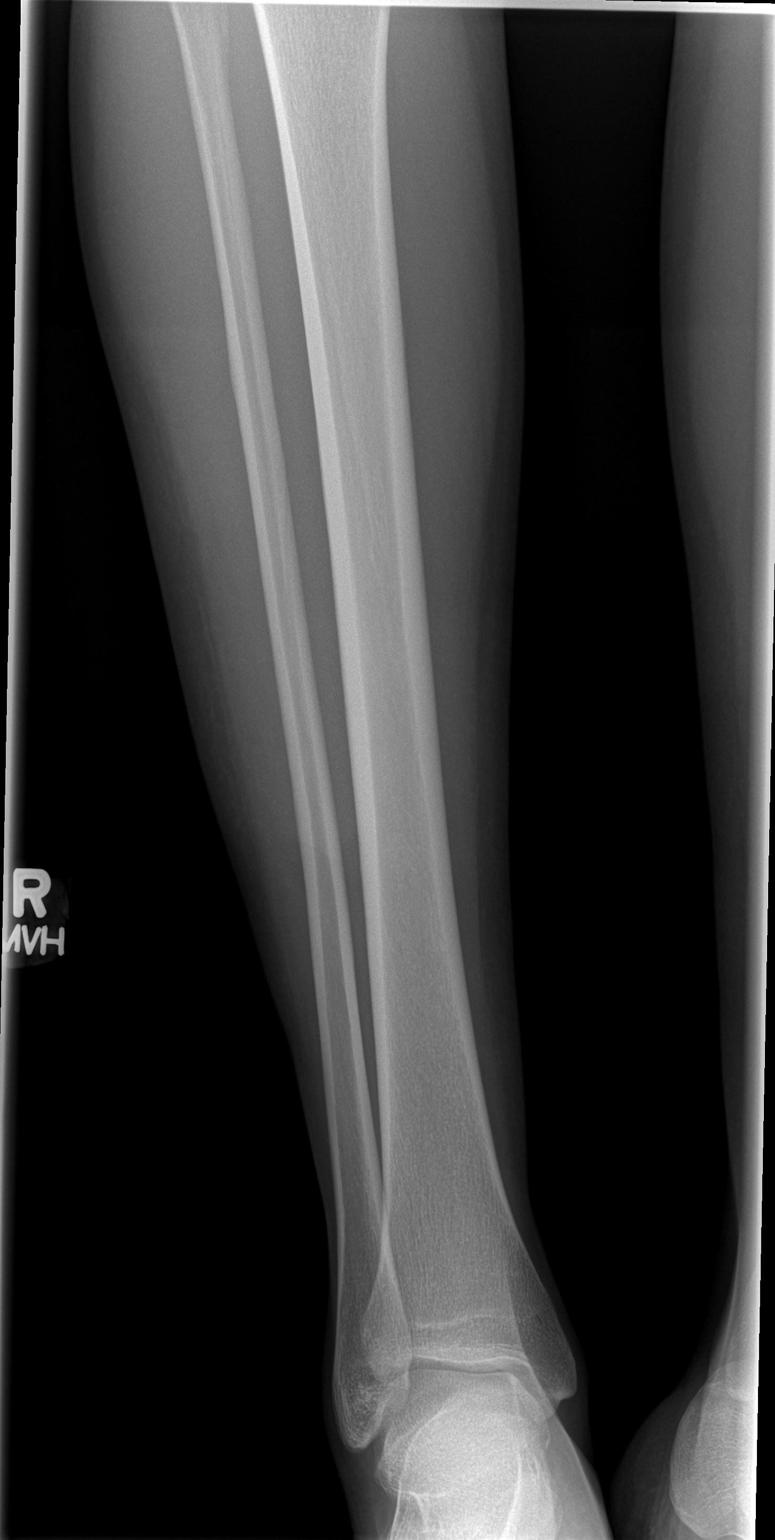

[t tib/fib ap right (2 of 2)]
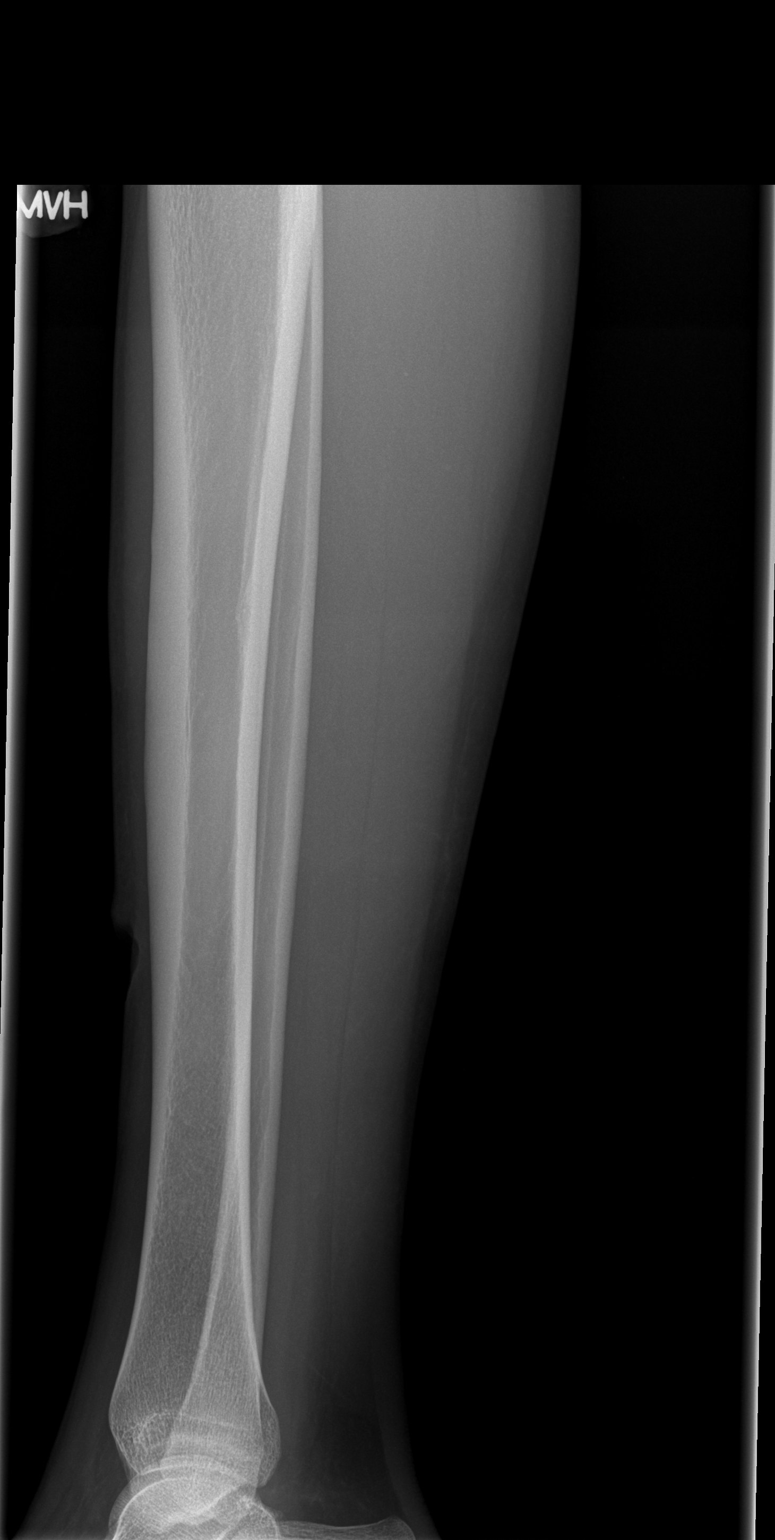

[t tib/fib lat right (2 of 2)]
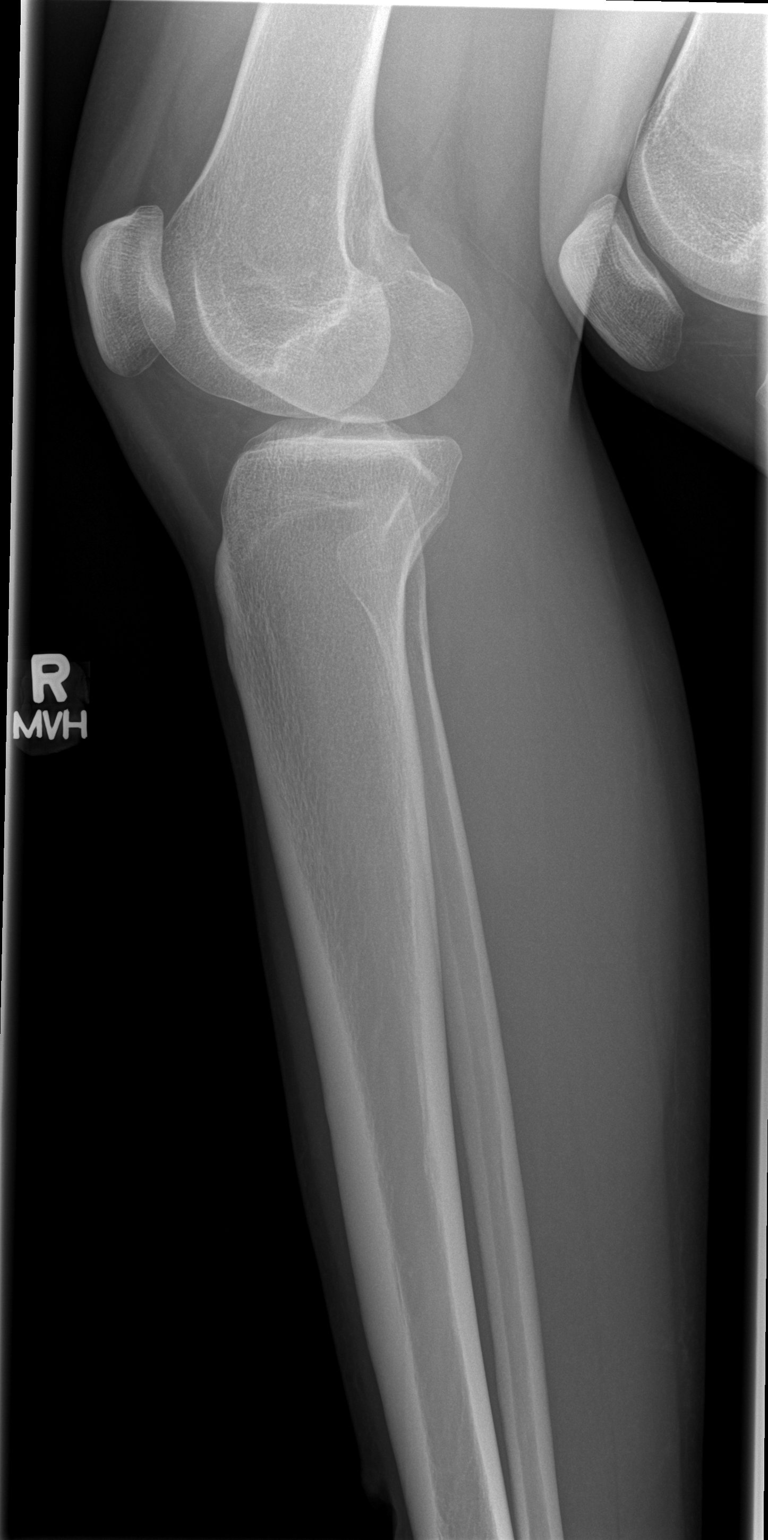

[4 of 4 positions shown; findings below may reference images not displayed]

FINDINGS: Soft tissue defect over the anterior aspect of the
mid/distal tibia consistent with history of penetrating injury.  No
underlying bony injury. No evidence of acute fracture or
subluxation.  No focal bone lesions.  Bone matrix and cortex appear
intact.  No abnormal radiopaque densities in the soft tissues.
IMPRESSION: Anterior soft tissue injury over the mid/distal tibia.  No evidence
of acute bony injury.

## 2011-05-28 ENCOUNTER — Encounter: Payer: Medicaid Other | Admitting: Family Medicine

## 2011-07-22 ENCOUNTER — Other Ambulatory Visit: Payer: Self-pay | Admitting: Family Medicine

## 2011-07-23 ENCOUNTER — Other Ambulatory Visit: Payer: Self-pay | Admitting: Family Medicine

## 2011-07-28 ENCOUNTER — Other Ambulatory Visit (HOSPITAL_COMMUNITY)
Admission: RE | Admit: 2011-07-28 | Discharge: 2011-07-28 | Disposition: A | Discharge status: Home / Self Care | Payer: Medicaid Other | Source: Ambulatory Visit | Attending: Family Medicine | Admitting: Family Medicine

## 2011-07-28 ENCOUNTER — Ambulatory Visit (INDEPENDENT_AMBULATORY_CARE_PROVIDER_SITE_OTHER): Payer: Medicaid Other | Admitting: *Deleted

## 2011-07-28 ENCOUNTER — Ambulatory Visit (INDEPENDENT_AMBULATORY_CARE_PROVIDER_SITE_OTHER): Payer: Medicaid Other | Admitting: Family Medicine

## 2011-07-28 ENCOUNTER — Encounter: Payer: Self-pay | Admitting: Family Medicine

## 2011-07-28 VITALS — BP 113/88 | HR 97 | Ht 66.0 in | Wt 119.0 lb

## 2011-07-28 DIAGNOSIS — N760 Acute vaginitis: Secondary | ICD-10-CM

## 2011-07-28 DIAGNOSIS — Z113 Encounter for screening for infections with a predominantly sexual mode of transmission: Secondary | ICD-10-CM | POA: Insufficient documentation

## 2011-07-28 DIAGNOSIS — Z309 Encounter for contraceptive management, unspecified: Secondary | ICD-10-CM

## 2011-07-28 DIAGNOSIS — Z3049 Encounter for surveillance of other contraceptives: Secondary | ICD-10-CM

## 2011-07-28 DIAGNOSIS — N72 Inflammatory disease of cervix uteri: Secondary | ICD-10-CM

## 2011-07-28 DIAGNOSIS — J309 Allergic rhinitis, unspecified: Secondary | ICD-10-CM

## 2011-07-28 DIAGNOSIS — Z124 Encounter for screening for malignant neoplasm of cervix: Secondary | ICD-10-CM

## 2011-07-28 DIAGNOSIS — Z01419 Encounter for gynecological examination (general) (routine) without abnormal findings: Secondary | ICD-10-CM | POA: Insufficient documentation

## 2011-07-28 LAB — POCT WET PREP (WET MOUNT): WBC, Wet Prep HPF POC: >20

## 2011-07-28 MED ORDER — CETIRIZINE HCL 10 MG PO TABS: 10.0000 mg | ORAL_TABLET | Freq: Every day | ORAL | Status: AC

## 2011-07-28 MED ORDER — MEDROXYPROGESTERONE ACETATE 150 MG/ML IM SUSP
150.0000 mg | Freq: Once | INTRAMUSCULAR | Status: AC
  Administered 2011-07-28: 150 mg via INTRAMUSCULAR

## 2011-07-28 NOTE — Progress Notes (Signed)
Next depo due July 15 thru July 29. 2013.

## 2011-07-28 NOTE — Patient Instructions (Signed)
Nice to see you again. Your allergies may be caused by pollen or dog dander. i would start with antihistamines, zyrtec is good to try. If symptoms still bothersome, make an appointment to discuss other options. Make sure to get plenty of calcium and vitamin D. Make an appointment to discuss other birth control if interested.  Health Maintenance, Females A healthy lifestyle and preventative care can promote health and wellness.  Maintain regular health, dental, and eye exams.   Eat a healthy diet. Foods like vegetables, fruits, whole grains, low-fat dairy products, and lean protein foods contain the nutrients you need without too many calories. Decrease your intake of foods high in solid fats, added sugars, and salt. Get information about a proper diet from your caregiver, if necessary.   Regular physical exercise is one of the most important things you can do for your health. Most adults should get at least 150 minutes of moderate-intensity exercise (any activity that increases your heart rate and causes you to sweat) each week. In addition, most adults need muscle-strengthening exercises on 2 or more days a week.    Maintain a healthy weight. The body mass index (BMI) is a screening tool to identify possible weight problems. It provides an estimate of body fat based on height and weight. Your caregiver can help determine your BMI, and can help you achieve or maintain a healthy weight. For adults 20 years and older:   A BMI below 18.5 is considered underweight.   A BMI of 18.5 to 24.9 is normal.   A BMI of 25 to 29.9 is considered overweight.   A BMI of 30 and above is considered obese.   Maintain normal blood lipids and cholesterol by exercising and minimizing your intake of saturated fat. Eat a balanced diet with plenty of fruits and vegetables. Blood tests for lipids and cholesterol should begin at age 46 and be repeated every 5 years. If your lipid or cholesterol levels are high, you  are over 50, or you are a high risk for heart disease, you may need your cholesterol levels checked more frequently.Ongoing high lipid and cholesterol levels should be treated with medicines if diet and exercise are not effective.   If you smoke, find out from your caregiver how to quit. If you do not use tobacco, do not start.   If you are pregnant, do not drink alcohol. If you are breastfeeding, be very cautious about drinking alcohol. If you are not pregnant and choose to drink alcohol, do not exceed 1 drink per day. One drink is considered to be 12 ounces (355 mL) of beer, 5 ounces (148 mL) of wine, or 1.5 ounces (44 mL) of liquor.   Avoid use of street drugs. Do not share needles with anyone. Ask for help if you need support or instructions about stopping the use of drugs.   High blood pressure causes heart disease and increases the risk of stroke. Blood pressure should be checked at least every 1 to 2 years. Ongoing high blood pressure should be treated with medicines, if weight loss and exercise are not effective.   If you are 42 to 27 years old, ask your caregiver if you should take aspirin to prevent strokes.   Diabetes screening involves taking a blood sample to check your fasting blood sugar level. This should be done once every 3 years, after age 12, if you are within normal weight and without risk factors for diabetes. Testing should be considered at a younger age  or be carried out more frequently if you are overweight and have at least 1 risk factor for diabetes.   Breast cancer screening is essential preventative care for women. You should practice "breast self-awareness." This means understanding the normal appearance and feel of your breasts and may include breast self-examination. Any changes detected, no matter how small, should be reported to a caregiver. Women in their 45s and 30s should have a clinical breast exam (CBE) by a caregiver as part of a regular health exam every 1 to 3  years. After age 20, women should have a CBE every year. Starting at age 69, women should consider having a mammogram (breast X-ray) every year. Women who have a family history of breast cancer should talk to their caregiver about genetic screening. Women at a high risk of breast cancer should talk to their caregiver about having an MRI and a mammogram every year.   The Pap test is a screening test for cervical cancer. Women should have a Pap test starting at age 67. Between ages 70 and 47, Pap tests should be repeated every 2 years. Beginning at age 58, you should have a Pap test every 3 years as long as the past 3 Pap tests have been normal. If you had a hysterectomy for a problem that was not cancer or a condition that could lead to cancer, then you no longer need Pap tests. If you are between ages 39 and 91, and you have had normal Pap tests going back 10 years, you no longer need Pap tests. If you have had past treatment for cervical cancer or a condition that could lead to cancer, you need Pap tests and screening for cancer for at least 20 years after your treatment. If Pap tests have been discontinued, risk factors (such as a new sexual partner) need to be reassessed to determine if screening should be resumed. Some women have medical problems that increase the chance of getting cervical cancer. In these cases, your caregiver may recommend more frequent screening and Pap tests.   The human papillomavirus (HPV) test is an additional test that may be used for cervical cancer screening. The HPV test looks for the virus that can cause the cell changes on the cervix. The cells collected during the Pap test can be tested for HPV. The HPV test could be used to screen women aged 38 years and older, and should be used in women of any age who have unclear Pap test results. After the age of 54, women should have HPV testing at the same frequency as a Pap test.   Colorectal cancer can be detected and often  prevented. Most routine colorectal cancer screening begins at the age of 30 and continues through age 29. However, your caregiver may recommend screening at an earlier age if you have risk factors for colon cancer. On a yearly basis, your caregiver may provide home test kits to check for hidden blood in the stool. Use of a small camera at the end of a tube, to directly examine the colon (sigmoidoscopy or colonoscopy), can detect the earliest forms of colorectal cancer. Talk to your caregiver about this at age 95, when routine screening begins. Direct examination of the colon should be repeated every 5 to 10 years through age 62, unless early forms of pre-cancerous polyps or small growths are found.   Hepatitis C blood testing is recommended for all people born from [birth year] through 1966 and any individual with known risks for hepatitis  C.   Practice safe sex. Use condoms and avoid high-risk sexual practices to reduce the spread of sexually transmitted infections (STIs). Sexually active women aged 30 and younger should be checked for Chlamydia, which is a common sexually transmitted infection. Older women with new or multiple partners should also be tested for Chlamydia. Testing for other STIs is recommended if you are sexually active and at increased risk.   Osteoporosis is a disease in which the bones lose minerals and strength with aging. This can result in serious bone fractures. The risk of osteoporosis can be identified using a bone density scan. Women ages 55 and over and women at risk for fractures or osteoporosis should discuss screening with their caregivers. Ask your caregiver whether you should be taking a calcium supplement or vitamin D to reduce the rate of osteoporosis.   Menopause can be associated with physical symptoms and risks. Hormone replacement therapy is available to decrease symptoms and risks. You should talk to your caregiver about whether hormone replacement therapy is right for you.     Use sunscreen with a sun protection factor (SPF) of 30 or greater. Apply sunscreen liberally and repeatedly throughout the day. You should seek shade when your shadow is shorter than you. Protect yourself by wearing long sleeves, pants, a wide-brimmed hat, and sunglasses year round, whenever you are outdoors.   Notify your caregiver of new moles or changes in moles, especially if there is a change in shape or color. Also notify your caregiver if a mole is larger than the size of a pencil eraser.   Stay current with your immunizations.  Document Released: 09/30/2010 Document Revised: 03/06/2011 Document Reviewed: 09/30/2010 [facility] Patient Information 2012 [place], [place].

## 2011-07-29 ENCOUNTER — Encounter: Payer: Self-pay | Admitting: Family Medicine

## 2011-07-29 NOTE — Progress Notes (Signed)
  Subjective:     Sharon Joseph is a 26 y.o. female and is here for a comprehensive physical exam. The patient reports problems - eczema-using prn steroid ointment with good result.  Has developed some rhinitis and allergy symptoms only when exposed to dogs. Asks what can be used for symptoms.  Has been on depo for almost one year, amenorrhea usually. Denies any irregular bleeding.   History   Social History  . Marital Status: Single    Spouse Name: N/A    Number of Children: N/A  . Years of Education: N/A   Occupational History  . Not on file.   Social History Main Topics  . Smoking status: Never Smoker   . Smokeless tobacco: Not on file  . Alcohol Use: No  . Drug Use: No  . Sexually Active: Not Currently   Other Topics Concern  . Not on file   Social History Narrative  . No narrative on file   Health Maintenance  Topic Date Due  . Influenza Vaccine  12/30/2011  . Tetanus/tdap  01/29/2013  . Pap Smear  04/23/2013    The following portions of the patient's history were reviewed and updated as appropriate: allergies, current medications, past family history, past medical history, past social history, past surgical history and problem list.  Review of Systems Pertinent items are noted in HPI.   Objective:    BP 113/88  Pulse 97  Ht 5\' 6"  (1.676 m)  Wt 53.978 kg (119 lb)  BMI 19.21 kg/m2 General appearance: alert, cooperative, appears stated age and no distress Head: Normocephalic, without obvious abnormality, atraumatic Eyes: conjunctivae/corneas clear. PERRL, EOM's intact. Fundi benign. Nose: Nares normal. Septum midline. Mucosa normal. No drainage or sinus tenderness. Throat: lips, mucosa, and tongue normal; teeth and gums normal Neck: no adenopathy, supple, symmetrical, trachea midline and thyroid not enlarged, symmetric, no tenderness/mass/nodules Lungs: clear to auscultation bilaterally Heart: regular rate and rhythm, S1, S2 normal, no murmur, click, rub or  gallop Abdomen: soft, non-tender; bowel sounds normal; no masses,  no organomegaly Pelvic: cervix normal in appearance, external genitalia normal, no adnexal masses or tenderness, no cervical motion tenderness, rectovaginal septum normal, uterus normal size, shape, and consistency and white thick discharge noted. Extremities: extremities normal, atraumatic, no cyanosis or edema Skin: Skin color, texture, turgor normal. No rashes or lesions Neurologic: Grossly normal    Assessment:    Healthy female exam. Depo provera for contraception    Plan:     See After Visit Summary for Counseling Recommendations   Screening pap and GC/Ch today. History of ascus last year followed by one normal pap.   Discussed contraception options. Patient desires to continue depo for now. Advised adequate calcium and vitamin D intake.   Allergic rhinitis. Advised trial of zyrtec especially prior to allergen exposure. Discusses f/u if not controlled or worsens.

## 2011-07-30 ENCOUNTER — Telehealth: Payer: Self-pay | Admitting: *Deleted

## 2011-07-30 NOTE — Telephone Encounter (Signed)
Message copied by Deno Etienne on Wed Jul 30, 2011  9:10 AM ------      Message from: Durwin Reges      Created: Tue Jul 29, 2011  5:01 PM      Regarding: yeast       Please call patient to inform she only had some yeast on her tests. She may use OTC antifungal (like monistat) for treatment. She will get a letter about pap test.

## 2011-07-30 NOTE — Telephone Encounter (Signed)
Called and informed pt of results and told her that she can use Monistat to help with the yeast inf if she continues to have any problems, also informed pt that she will receive results of pap in the mail. Pt understood and agreed.Sharon Joseph

## 2011-07-31 ENCOUNTER — Telehealth: Payer: Self-pay | Admitting: Family Medicine

## 2011-07-31 NOTE — Telephone Encounter (Signed)
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 Recommend repeat colposcopy to the patient. She agrees to make appointment.

## 2011-08-07 ENCOUNTER — Ambulatory Visit (INDEPENDENT_AMBULATORY_CARE_PROVIDER_SITE_OTHER): Payer: Medicaid Other | Admitting: Family Medicine

## 2011-08-07 VITALS — BP 108/72 | HR 76 | Temp 98.3°F | Ht 66.0 in | Wt 118.0 lb

## 2011-08-07 DIAGNOSIS — IMO0002 Reserved for concepts with insufficient information to code with codable children: Secondary | ICD-10-CM

## 2011-08-07 DIAGNOSIS — R87612 Low grade squamous intraepithelial lesion on cytologic smear of cervix (LGSIL): Secondary | ICD-10-CM

## 2011-08-07 NOTE — Progress Notes (Signed)
Patient ID: Sharon Joseph, female   DOB: July 05, 1985, 26 y.o.   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  Placed in lithotomy position. Cervix viewed with speculum and colposcope after application of acetic acid.   Colposcopy adequate (entire squamocolumnar junctions seen  in entirety) ?  Extremely well Acetowhite lesions?No Punctation?no Mosaicism?  no Abnormal vasculature?  no Biopsies?no ECC?no Complications? no  COMMENTS: Patient was given post procedure instructions.  I reviewed the guidleines with her--she should have repeat CO-TESTING in one year. She will schedule with me in [facility] clinic. If negative at that time, can retrurn to screen q 3 y.

## 2011-08-07 NOTE — Assessment & Plan Note (Signed)
colpo negative today--will do CO-TEST 1 year.

## 2011-10-14 ENCOUNTER — Ambulatory Visit: Payer: Self-pay

## 2012-03-04 ENCOUNTER — Encounter (HOSPITAL_COMMUNITY): Payer: Self-pay | Admitting: *Deleted

## 2012-03-04 ENCOUNTER — Emergency Department (INDEPENDENT_AMBULATORY_CARE_PROVIDER_SITE_OTHER)
Admission: EM | Admit: 2012-03-04 | Discharge: 2012-03-04 | Disposition: A | Discharge status: Home / Self Care | Payer: Self-pay | Source: Home / Self Care | Attending: Emergency Medicine | Admitting: Emergency Medicine

## 2012-03-04 DIAGNOSIS — J039 Acute tonsillitis, unspecified: Secondary | ICD-10-CM

## 2012-03-04 LAB — POCT RAPID STREP A: Streptococcus, Group A Screen (Direct): NEGATIVE

## 2012-03-04 MED ORDER — AMOXICILLIN 500 MG PO CAPS: 500.0000 mg | ORAL_CAPSULE | Freq: Three times a day (TID) | ORAL | Status: AC

## 2012-03-04 MED ORDER — ACETAMINOPHEN-CODEINE #3 300-30 MG PO TABS: 1.0000 | ORAL_TABLET | Freq: Four times a day (QID) | ORAL | Status: AC | PRN

## 2012-03-04 NOTE — ED Notes (Signed)
Pt  Reports  Symptoms  Of  sorethroat   Pt  Reports   Hurts  To  Swallow           Symptoms   Started  Yesterday

## 2012-03-04 NOTE — ED Provider Notes (Signed)
History     CSN: [id number]  Arrival date & time 03/04/12  4098   First MD Initiated Contact with Patient 03/04/12 0840      Chief Complaint  Patient presents with  . Sore Throat    (Consider location/radiation/quality/duration/timing/severity/associated sxs/prior treatment) HPI Comments: Patient presents urgent care complaining of a sore throat started yesterday and this morning woke up with increased pain feeling tired denies any cough congestion body aches or fevers.  Patient is a 26 y.o. female presenting with pharyngitis. The history is provided by the patient.  Sore Throat This is a new problem. The current episode started yesterday. The problem occurs constantly. The problem has been gradually worsening. Associated symptoms include abdominal pain. Pertinent negatives include no headaches and no shortness of breath. The symptoms are aggravated by swallowing. Nothing relieves the symptoms. She has tried nothing for the symptoms. The treatment provided no relief.    Past Medical History  Diagnosis Date  . Eczema   . HSV (herpes simplex virus) infection     History reviewed. No pertinent past surgical history.  No family history on file.  History  Substance Use Topics  . Smoking status: Never Smoker   . Smokeless tobacco: Not on file  . Alcohol Use: No    OB History    Grav Para Term Preterm Abortions TAB SAB Ect Mult Living                  Review of Systems  Constitutional: Positive for activity change. Negative for fever, chills, diaphoresis, fatigue and unexpected weight change.  HENT: Positive for sore throat. Negative for congestion, facial swelling, drooling, trouble swallowing, neck pain, neck stiffness and sinus pressure.   Respiratory: Negative for cough and shortness of breath.   Gastrointestinal: Positive for abdominal pain. Negative for vomiting and rectal pain.  Genitourinary: Negative for difficulty urinating.  Musculoskeletal: Negative for back  pain, joint swelling, arthralgias and gait problem.  Neurological: Negative for dizziness and headaches.    Allergies  Review of patient's allergies indicates no known allergies.  Home Medications   Current Outpatient Rx  Name  Route  Sig  Dispense  Refill  . ACETAMINOPHEN-CODEINE #3 300-30 MG PO TABS   Oral   Take 1-2 tablets by mouth every 6 (six) hours as needed for pain.   15 tablet   0   . AMOXICILLIN 500 MG PO CAPS   Oral   Take 1 capsule (500 mg total) by mouth 3 (three) times daily.   30 capsule   0   . CETIRIZINE HCL 10 MG PO TABS   Oral   Take 1 tablet (10 mg total) by mouth daily.   30 tablet   11   . FLUOCINONIDE 0.05 % EX CREA      APPLY TOPICALLY TWICE DAILY   30 g   1   . HYDROCORTISONE 2.5 % EX OINT      APPLY TO RASH ON FACE TWICE DAILY UNTIL RASH CLEARS THEN STOP   28.35 g   0   . MEDROXYPROGESTERONE ACETATE 150 MG/ML IM SUSP   Intramuscular   Inject 1 mL (150 mg total) into the muscle every 3 (three) months.   1 mL   0   . TRIAMCINOLONE ACETONIDE 0.5 % EX CREA      Apply to affected areas as directed.  No more than 10 days.            BP 129/85  Pulse 90  Temp 98.8 F (37.1 C) (Oral)  Resp 18  SpO2 99%  LMP 02/27/2012  Physical Exam  Nursing note and vitals reviewed. Constitutional: Vital signs are normal. She appears well-developed and well-nourished.  Non-toxic appearance. She does not have a sickly appearance. She does not appear ill. No distress.  HENT:  Head: Atraumatic.  Right Ear: Tympanic membrane normal.  Left Ear: Tympanic membrane normal.  Mouth/Throat: Uvula is midline. Oropharyngeal exudate and posterior oropharyngeal erythema present. No tonsillar abscesses.  Eyes: Conjunctivae normal are normal.  Neck: Neck supple. No JVD present. No tracheal deviation present. No thyromegaly present.  Cardiovascular: Normal rate.   Pulmonary/Chest: Effort normal and breath sounds normal.  Abdominal: Soft.  Lymphadenopathy:     She has cervical adenopathy.  Neurological: She is alert.  Skin: No rash noted. No erythema.    ED Course  Procedures (including critical care time)   Labs Reviewed  POCT RAPID STREP A ([facility]RG CARE ONLY)   No results found.   1. Tonsillitis with exudate       MDM  Tonsillitis for less than 48 hours. No signs of a peritonsillar abscess. Patient has been started on antibiotics with amoxicillin for 10 days. Was advised and instructed about symptoms that should warrant his return for recheck patient agrees with treatment plan and followup care as necessary.        Jimmie Molly, MD 03/04/12 1251

## 2013-12-08 ENCOUNTER — Encounter (HOSPITAL_COMMUNITY): Payer: Self-pay | Admitting: Emergency Medicine

## 2013-12-08 ENCOUNTER — Emergency Department (INDEPENDENT_AMBULATORY_CARE_PROVIDER_SITE_OTHER)
Admission: EM | Admit: 2013-12-08 | Discharge: 2013-12-08 | Disposition: A | Discharge status: Home / Self Care | Payer: Self-pay | Source: Home / Self Care | Attending: Family Medicine | Admitting: Family Medicine

## 2013-12-08 DIAGNOSIS — H1045 Other chronic allergic conjunctivitis: Secondary | ICD-10-CM

## 2013-12-08 DIAGNOSIS — H1012 Acute atopic conjunctivitis, left eye: Secondary | ICD-10-CM

## 2013-12-08 NOTE — Discharge Instructions (Signed)
Thank you for coming in today. °Use over-the-counter Zaditor eyedrops (Ketotifen) °Use over-the-counter Zyrtec (cetirizine)  °Use Systane artificial tears as needed ° ° °Allergic Conjunctivitis °The conjunctiva is a thin membrane that covers the visible white part of the eyeball and the underside of the eyelids. This membrane protects and lubricates the eye. The membrane has small blood vessels running through it that can normally be seen. When the conjunctiva becomes inflamed, the condition is called conjunctivitis. In response to the inflammation, the conjunctival blood vessels become swollen. The swelling results in redness in the normally white part of the eye. °The blood vessels of this membrane also react when a person has allergies and is then called allergic conjunctivitis. This condition usually lasts for as long as the allergy persists. Allergic conjunctivitis cannot be passed to another person (non-contagious). The likelihood of bacterial infection is great and the cause is not likely due to allergies if the inflamed eye has: °· A sticky discharge. °· Discharge or sticking together of the lids in the morning. °· Scaling or flaking of the eyelids where the eyelashes come out. °· Red swollen eyelids. °CAUSES  °· Viruses. °· Irritants such as foreign bodies. °· Chemicals. °· General allergic reactions. °· Inflammation or serious diseases in the inside or the outside of the eye or the orbit (the boney cavity in which the eye sits) can cause a "red eye." °SYMPTOMS  °· Eye redness. °· Tearing. °· Itchy eyes. °· Burning feeling in the eyes. °· Clear drainage from the eye. °· Allergic reaction due to pollens or ragweed sensitivity. Seasonal allergic conjunctivitis is frequent in the spring when pollens are in the air and in the fall. °DIAGNOSIS  °This condition, in its many forms, is usually diagnosed based on the history and an ophthalmological exam. It usually involves both eyes. If your eyes react at the same  time every year, allergies may be the cause. While most "red eyes" are due to allergy or an infection, the role of an eye (ophthalmological) exam is important. The exam can rule out serious diseases of the eye or orbit. °TREATMENT  °· Non-antibiotic eye drops, ointments, or medications by mouth may be prescribed if the ophthalmologist is sure the conjunctivitis is due to allergies alone. °· Over-the-counter drops and ointments for allergic symptoms should be used only after other causes of conjunctivitis have been ruled out, or as your caregiver suggests. °Medications by mouth are often prescribed if other allergy-related symptoms are present. If the ophthalmologist is sure that the conjunctivitis is due to allergies alone, treatment is normally limited to drops or ointments to reduce itching and burning. °HOME CARE INSTRUCTIONS  °· Wash hands before and after applying drops or ointments, or touching the inflamed eye(s) or eyelids. °· Do not let the eye dropper tip or ointment tube touch the eyelid when putting medicine in your eye. °· Stop using your soft contact lenses and throw them away. Use a new pair of lenses when recovery is complete. You should run through sterilizing cycles at least three times before use after complete recovery if the old soft contact lenses are to be used. Hard contact lenses should be stopped. They need to be thoroughly sterilized before use after recovery. °· Itching and burning eyes due to allergies is often relieved by using a cool cloth applied to closed eye(s). °SEEK MEDICAL CARE IF:  °· Your problems do not go away after two or three days of treatment. °· Your lids are sticky (especially in the   morning when you wake up) or stick together. °· Discharge develops. Antibiotics may be needed either as drops, ointment, or by mouth. °· You have extreme light sensitivity. °· An oral temperature above 102° F (38.9° C) develops. °· Pain in or around the eye or any other visual symptom  develops. °MAKE SURE YOU:  °· Understand these instructions. °· Will watch your condition. °· Will get help right away if you are not doing well or get worse. °Document Released: 06/07/2002 Document Revised: 06/09/2011 Document Reviewed: 05/03/2007 °[employer]® Patient Information ©2015 [employer]. This information is not intended to replace advice given to you by your health care provider. Make sure you discuss any questions you have with your health care provider. ° °

## 2013-12-08 NOTE — ED Provider Notes (Signed)
Sharon Joseph is a 28 y.o. female who presents to Urgent Care today for left eye irritation. Patient noted one day of mild left eye irritation associated with mild blurriness. No photophobia or significant pain. Patient has a mild foreign body sensation as well. She does state she was very high. She denies any injury or foreign body exposure. She's tried some over-the-counter medications which have not helped. No fevers or chills nausea vomiting or diarrhea   Past Medical History  Diagnosis Date  . Eczema   . HSV (herpes simplex virus) infection    History  Substance Use Topics  . Smoking status: Never Smoker   . Smokeless tobacco: Not on file  . Alcohol Use: No   ROS as above Medications: No current facility-administered medications for this encounter.   Current Outpatient Prescriptions  Medication Sig Dispense Refill  . acetaminophen-codeine (TYLENOL #3) 300-30 MG per tablet Take 1-2 tablets by mouth every 6 (six) hours as needed for pain.  15 tablet  0  . cetirizine (ZYRTEC) 10 MG tablet Take 1 tablet (10 mg total) by mouth daily.  30 tablet  11  . fluocinonide cream (LIDEX) 0.05 % APPLY TOPICALLY TWICE DAILY  30 g  1  . hydrocortisone 2.5 % ointment APPLY TO RASH ON FACE TWICE DAILY UNTIL RASH CLEARS THEN STOP  28.35 g  0  . medroxyPROGESTERone (DEPO-PROVERA) 150 MG/ML injection Inject 1 mL (150 mg total) into the muscle every 3 (three) months.  1 mL  0  . triamcinolone (KENALOG) 0.5 % cream Apply to affected areas as directed.  No more than 10 days.         Exam:  BP 119/82  Pulse 93  Temp(Src) 98.3 F (36.8 C) (Oral)  Resp 12  SpO2 97%  LMP 11/17/2013 Gen: Well NAD HEENT: EOMI,  MMM minimal left eye conjunctiva injection. Normal bilaterally otherwise. Normal  fluorescein stain Lungs: Normal work of breathing. CTABL Heart: RRR no MRG Abd: NABS, Soft. Nondistended, Nontender Exts: Brisk capillary refill, warm and well perfused.   Visual Acuity:  Right Eye  Distance: 20/25 Left Eye Distance: 20/40 Bilateral Distance: 20/25   No results found for this or any previous visit (from the past 24 hour(s)). No results found.  Assessment and Plan: 28 y.o. female with allergic conjunctivitis. Most likely explanation. Plan for Zaditor eyedrops, cetirizine. Followup with ophthalmology if not better.  Discussed warning signs or symptoms. Please see discharge instructions. Patient expresses understanding.   This note was created using Conservation officer, historic buildings. Any transcription errors are unintended.    Rodolph Bong, MD 12/08/13 1336

## 2013-12-08 NOTE — ED Notes (Signed)
C/o  Redness, drainage, and mild pain in left eye.  On set today.  Pt has tried otc eye drops with no relief.  States "eye drops seemed to make it worse".  Denies fever, n/v/d

## 2014-03-27 ENCOUNTER — Ambulatory Visit (INDEPENDENT_AMBULATORY_CARE_PROVIDER_SITE_OTHER): Payer: Medicaid Other | Admitting: Family Medicine

## 2014-03-27 ENCOUNTER — Encounter: Payer: Self-pay | Admitting: Family Medicine

## 2014-03-27 DIAGNOSIS — Z309 Encounter for contraceptive management, unspecified: Secondary | ICD-10-CM | POA: Insufficient documentation

## 2014-03-27 DIAGNOSIS — IMO0002 Reserved for concepts with insufficient information to code with codable children: Secondary | ICD-10-CM

## 2014-03-27 DIAGNOSIS — R896 Abnormal cytological findings in specimens from other organs, systems and tissues: Secondary | ICD-10-CM

## 2014-03-27 DIAGNOSIS — Z30013 Encounter for initial prescription of injectable contraceptive: Secondary | ICD-10-CM

## 2014-03-27 LAB — POCT URINE PREGNANCY: Preg Test, Ur: NEGATIVE

## 2014-03-27 MED ORDER — MEDROXYPROGESTERONE ACETATE 150 MG/ML IM SUSP
150.0000 mg | Freq: Once | INTRAMUSCULAR | Status: AC
  Administered 2014-03-27: 150 mg via INTRAMUSCULAR

## 2014-03-27 NOTE — Patient Instructions (Signed)
It was nice to see you.  Please follow up for a repeat pap smear (given prior abnormals).  Happy holidays  Dr. Adriana Simasook

## 2014-03-27 NOTE — Assessment & Plan Note (Signed)
Restarting Depo-Provera today after urine pregnancy test.

## 2014-03-27 NOTE — Progress Notes (Signed)
   Subjective:    Patient ID: Sharon Joseph, female    DOB: [birth year], 28 y.o.   MRN: [id number]  HPI 28 year old female with past history of abnormal Pap smear (most recently LGSIL, 2013) presents to restart Depo-Provera.  1) Contraception  Patient reports she is currently sexually active and practices safe sex.  She has been on Depo-Provera but has missed her last dose in October.  She is very pleased with Depo-Provera regarding contraception and would like to resume.  She does not want to discuss other options at this time.  No other complaints at this time.  Review of Systems Per HPI    Objective:   Physical Exam Filed Vitals:   03/27/14 1616  BP: 101/70  Pulse: 82  Temp: 98.4 F (36.9 C)  Resp: 18   Exam: General: Well-appearing female no acute distress. Psych: Normal mood and affect.    Assessment & Plan:  See problem list

## 2014-03-27 NOTE — Assessment & Plan Note (Addendum)
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  Some cells were suspicious for high-grade lesion. Colposcopy was performed by Dr. Jennette KettleNeal and was negative.  She recommended co-testing in 1 year.  Patient has not followed up regarding this. I encouraged her to follow-up with me to obtain a repeat Pap smear ASAP.

## 2014-06-14 ENCOUNTER — Ambulatory Visit (INDEPENDENT_AMBULATORY_CARE_PROVIDER_SITE_OTHER): Payer: Medicaid Other | Admitting: *Deleted

## 2014-06-14 DIAGNOSIS — Z3042 Encounter for surveillance of injectable contraceptive: Secondary | ICD-10-CM

## 2014-06-14 MED ORDER — MEDROXYPROGESTERONE ACETATE 150 MG/ML IM SUSP
150.0000 mg | Freq: Once | INTRAMUSCULAR | Status: AC
  Administered 2014-06-14: 150 mg via INTRAMUSCULAR

## 2014-06-14 NOTE — Progress Notes (Signed)
Patient in today for depo-provera. Injection given in right ventrogluteal, patient without complaints, site unremarkable. Next depo due: June 1 - June 15, reminder card given.

## 2014-08-30 ENCOUNTER — Ambulatory Visit (INDEPENDENT_AMBULATORY_CARE_PROVIDER_SITE_OTHER): Payer: Medicaid Other | Admitting: *Deleted

## 2014-08-30 DIAGNOSIS — Z3042 Encounter for surveillance of injectable contraceptive: Secondary | ICD-10-CM

## 2014-08-30 MED ORDER — MEDROXYPROGESTERONE ACETATE 150 MG/ML IM SUSP
150.0000 mg | Freq: Once | INTRAMUSCULAR | Status: AC
  Administered 2014-08-30: 150 mg via INTRAMUSCULAR

## 2014-08-30 NOTE — Progress Notes (Signed)
   Pt in for Depo Provera injection.  Pt tolerated Depo injection. Depo given left upper outer quadrant.  Next injection due August 17-November 29, 2014.  Reminder card given. Clovis PuMartin, Usiel Astarita L, RN

## 2015-01-03 ENCOUNTER — Ambulatory Visit (INDEPENDENT_AMBULATORY_CARE_PROVIDER_SITE_OTHER): Payer: Medicaid Other | Admitting: *Deleted

## 2015-01-03 DIAGNOSIS — Z3042 Encounter for surveillance of injectable contraceptive: Secondary | ICD-10-CM | POA: Diagnosis not present

## 2015-01-03 LAB — POCT URINE PREGNANCY: Preg Test, Ur: NEGATIVE

## 2015-01-03 MED ORDER — MEDROXYPROGESTERONE ACETATE 150 MG/ML IM SUSP
150.0000 mg | Freq: Once | INTRAMUSCULAR | Status: AC
  Administered 2015-01-03: 150 mg via INTRAMUSCULAR

## 2015-01-03 NOTE — Progress Notes (Signed)
Upreg negative, appt made for 01/17/15 for repeat upreg.  Pt tolerated injection well. Fleeger, Maryjo [place]

## 2015-01-03 NOTE — Progress Notes (Signed)
Pt in clinic today for a depo.  She was due 12/16/14-9/31/16.  She has unprotected sex in the past two weeks.  We will obtain a upreg today, if negative she will receive depo and will come back in two weeks for repeat upreg per [facility] policy. Felisha Claytor, Maryjo [place]

## 2015-01-17 ENCOUNTER — Ambulatory Visit: Payer: Medicaid Other

## 2015-06-06 ENCOUNTER — Ambulatory Visit: Payer: Medicaid Other

## 2015-07-11 ENCOUNTER — Encounter: Payer: Medicaid Other | Admitting: Internal Medicine
